# Patient Record
Sex: Female | Born: 1990 | Race: White | Hispanic: No | Marital: Married | Smoking: Never smoker
Health system: Southern US, Community
[De-identification: ages and names within clinical notes are randomized; demographics above are authoritative.]

---

## 2016-11-26 ENCOUNTER — Emergency Department (HOSPITAL_BASED_OUTPATIENT_CLINIC_OR_DEPARTMENT_OTHER): Payer: BLUE CROSS/BLUE SHIELD

## 2016-11-26 ENCOUNTER — Encounter (HOSPITAL_BASED_OUTPATIENT_CLINIC_OR_DEPARTMENT_OTHER): Payer: Self-pay | Admitting: Emergency Medicine

## 2016-11-26 ENCOUNTER — Emergency Department (HOSPITAL_BASED_OUTPATIENT_CLINIC_OR_DEPARTMENT_OTHER)
Admission: EM | Admit: 2016-11-26 | Discharge: 2016-11-26 | Disposition: A | Discharge status: Home / Self Care | Payer: BLUE CROSS/BLUE SHIELD | Attending: Emergency Medicine | Admitting: Emergency Medicine

## 2016-11-26 DIAGNOSIS — Y9389 Activity, other specified: Secondary | ICD-10-CM | POA: Diagnosis not present

## 2016-11-26 DIAGNOSIS — W500XXA Accidental hit or strike by another person, initial encounter: Secondary | ICD-10-CM | POA: Insufficient documentation

## 2016-11-26 DIAGNOSIS — S0992XA Unspecified injury of nose, initial encounter: Secondary | ICD-10-CM | POA: Insufficient documentation

## 2016-11-26 DIAGNOSIS — Y999 Unspecified external cause status: Secondary | ICD-10-CM | POA: Insufficient documentation

## 2016-11-26 DIAGNOSIS — J3489 Other specified disorders of nose and nasal sinuses: Secondary | ICD-10-CM | POA: Diagnosis not present

## 2016-11-26 DIAGNOSIS — Y929 Unspecified place or not applicable: Secondary | ICD-10-CM | POA: Diagnosis not present

## 2016-11-26 DIAGNOSIS — R0981 Nasal congestion: Secondary | ICD-10-CM | POA: Insufficient documentation
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
## 2016-11-26 NOTE — Discharge Instructions (Signed)
Use Zyrtec and/or flonase for congestion Follow up with ENT for persistent symptoms
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
## 2018-06-11 ENCOUNTER — Encounter (HOSPITAL_COMMUNITY): Payer: Self-pay

## 2018-06-11 ENCOUNTER — Emergency Department (HOSPITAL_COMMUNITY)
Admission: EM | Admit: 2018-06-11 | Discharge: 2018-06-11 | Disposition: A | Discharge status: Home / Self Care | Payer: BLUE CROSS/BLUE SHIELD | Attending: Emergency Medicine | Admitting: Emergency Medicine

## 2018-06-11 ENCOUNTER — Emergency Department (HOSPITAL_COMMUNITY): Payer: BLUE CROSS/BLUE SHIELD

## 2018-06-11 ENCOUNTER — Other Ambulatory Visit: Payer: Self-pay

## 2018-06-11 DIAGNOSIS — I10 Essential (primary) hypertension: Secondary | ICD-10-CM | POA: Insufficient documentation

## 2018-06-11 DIAGNOSIS — R0781 Pleurodynia: Secondary | ICD-10-CM

## 2018-06-11 DIAGNOSIS — R079 Chest pain, unspecified: Secondary | ICD-10-CM | POA: Diagnosis present

## 2018-06-11 LAB — I-STAT BETA HCG BLOOD, ED (MC, WL, AP ONLY): I-stat hCG, quantitative: <5 m[IU]/mL (ref <5)

## 2018-06-11 LAB — CBC WITH DIFFERENTIAL/PLATELET
Abs Immature Granulocytes: 0.06 10*3/uL (ref 0.00–0.07)
Basophils Absolute: 0.1 10*3/uL (ref 0.0–0.1)
Basophils Relative: 0 %
Eosinophils Absolute: 0.1 10*3/uL (ref 0.0–0.5)
Eosinophils Relative: 1 %
HCT: 45.7 % (ref 36.0–46.0)
Hemoglobin: 14.4 g/dL (ref 12.0–15.0)
Immature Granulocytes: 1 %
Lymphocytes Relative: 25 %
Lymphs Abs: 3.1 10*3/uL (ref 0.7–4.0)
MCH: 26.9 pg (ref 26.0–34.0)
MCHC: 31.5 g/dL (ref 30.0–36.0)
MCV: 85.4 fL (ref 80.0–100.0)
Monocytes Absolute: 0.8 10*3/uL (ref 0.1–1.0)
Monocytes Relative: 7 %
Neutro Abs: 8.2 10*3/uL — ABNORMAL HIGH (ref 1.7–7.7)
Neutrophils Relative %: 66 %
Platelets: 364 10*3/uL (ref 150–400)
RBC: 5.35 MIL/uL — ABNORMAL HIGH (ref 3.87–5.11)
RDW: 13.2 % (ref 11.5–15.5)
WBC: 12.2 10*3/uL — ABNORMAL HIGH (ref 4.0–10.5)
nRBC: 0 % (ref 0.0–0.2)

## 2018-06-11 LAB — I-STAT CHEM 8, ED
BUN: 8 mg/dL (ref 6–20)
Calcium, Ion: 0.99 mmol/L — ABNORMAL LOW (ref 1.15–1.40)
Chloride: 110 mmol/L (ref 98–111)
Creatinine, Ser: 0.8 mg/dL (ref 0.44–1.00)
Glucose, Bld: 101 mg/dL — ABNORMAL HIGH (ref 70–99)
HCT: 47 % — ABNORMAL HIGH (ref 36.0–46.0)
Hemoglobin: 16 g/dL — ABNORMAL HIGH (ref 12.0–15.0)
Potassium: 3.8 mmol/L (ref 3.5–5.1)
Sodium: 138 mmol/L (ref 135–145)
TCO2: 20 mmol/L — ABNORMAL LOW (ref 22–32)

## 2018-06-11 LAB — I-STAT TROPONIN, ED: Troponin i, poc: 0 ng/mL (ref 0.00–0.08)

## 2018-06-11 LAB — D-DIMER, QUANTITATIVE: D-Dimer, Quant: 6.69 ug/mL-FEU — ABNORMAL HIGH (ref 0.00–0.50)

## 2018-06-11 MED ORDER — IOPAMIDOL (ISOVUE-370) INJECTION 76%
INTRAVENOUS | Status: AC
  Administered 2018-06-11: 50 mL
  Filled 2018-06-11: qty 50

## 2018-06-11 NOTE — ED Notes (Signed)
Iv team unable to get iv access, EDP to attempt US IV

## 2018-06-11 NOTE — ED Notes (Signed)
Pt transported to xray 

## 2018-06-11 NOTE — ED Notes (Addendum)
IV team bedside. 

## 2018-06-11 NOTE — ED Provider Notes (Signed)
[facility] EMERGENCY DEPARTMENT Provider Note   CSN: [id number] Arrival date & time: 06/11/18  1233     History   Chief Complaint Chief Complaint  Patient presents with  . Chest Pain    HPI Ruth Strong is a 27 y.o. female who is previously healthy who presents with a one-week history of shortness of breath and 1 day history of pleuritic chest pain.  She describes the pain as sharp.  It is only present when she takes a deep breath.  She reports she has had a little bit of worsening dyspnea on exertion for the past week.  She reports she has never experienced anything like this before.  She reports she took a trip to [place] the end of October and feels like she has been short of breath around since then, but worse in the past week.  Patient reports that she has taken her blood pressure in the past couple days and has been elevated significantly every time.  Her heart rate has also been up to 107.  She has no history of hypertension.  She reports trying half of Ativan last night, which is her mother's, to see if that was anxiety related, however this did not resolve her symptoms.  She denies any recent surgeries, new leg pain or swelling, personal history of blood clots.  She is on oral contraceptive since age 21.  She has family history of PE.  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  Patient denies any drug or alcohol use.  HPI  History reviewed. No pertinent past medical history.  There are no active problems to display for this patient.   History reviewed. No pertinent surgical history.   OB History   None      Home Medications    Prior to Admission medications   Not on File    Family History History reviewed. No pertinent family history.  Social History Social History   Tobacco Use  . Smoking status: Never Smoker  . Smokeless tobacco: Never Used  Substance Use Topics  . Alcohol use: Never    Frequency: Never  . Drug use:  Never     Allergies   Patient has no known allergies.   Review of Systems Review of Systems  Constitutional: Negative for chills and fever.  HENT: Negative for facial swelling and sore throat.   Respiratory: Positive for shortness of breath.   Cardiovascular: Positive for chest pain.  Gastrointestinal: Negative for abdominal pain, nausea and vomiting.  Genitourinary: Negative for dysuria.  Musculoskeletal: Negative for back pain.  Skin: Negative for rash and wound.  Neurological: Negative for headaches.  Psychiatric/Behavioral: The patient is not nervous/anxious.      Physical Exam Updated Vital Signs BP (!) 155/105 (BP Location: Right Arm)   Pulse 91   Temp 97.6 F (36.4 C) (Oral)   Resp 17   LMP 05/28/2018 (Approximate)   SpO2 100%   Physical Exam  Constitutional: She appears well-developed and well-nourished. No distress.  HENT:  Head: Normocephalic and atraumatic.  Mouth/Throat: Oropharynx is clear and moist. No oropharyngeal exudate.  Eyes: Pupils are equal, round, and reactive to light. Conjunctivae are normal. Right eye exhibits no discharge. Left eye exhibits no discharge. No scleral icterus.  Neck: Normal range of motion. Neck supple. No thyromegaly present.  Cardiovascular: Normal rate, regular rhythm, normal heart sounds and intact distal pulses. Exam reveals no gallop and no friction rub.  No murmur heard.  Pulmonary/Chest: Effort normal and breath sounds normal. No stridor. No respiratory distress. She has no wheezes. She has no rales. She exhibits no tenderness.  Abdominal: Soft. Bowel sounds are normal. She exhibits no distension. There is no tenderness. There is no rebound and no guarding.  Musculoskeletal: She exhibits no edema.  Lymphadenopathy:    She has no cervical adenopathy.  Neurological: She is alert. Coordination normal.  Skin: Skin is warm and dry. No rash noted. She is not diaphoretic. No pallor.  Psychiatric: She has a normal mood and  affect.  Nursing note and vitals reviewed.    ED Treatments / Results  Labs (all labs ordered are listed, but only abnormal results are displayed) Labs Reviewed  CBC WITH DIFFERENTIAL/PLATELET - Abnormal; Notable for the following components:      Result Value   WBC 12.2 (*)    RBC 5.35 (*)    Neutro Abs 8.2 (*)    All other components within normal limits  D-DIMER, QUANTITATIVE (NOT AT [facility]) - Abnormal; Notable for the following components:   D-Dimer, Quant 6.69 (*)    All other components within normal limits  I-STAT CHEM 8, ED - Abnormal; Notable for the following components:   Glucose, Bld 101 (*)    Calcium, Ion 0.99 (*)    TCO2 20 (*)    Hemoglobin 16.0 (*)    HCT 47.0 (*)    All other components within normal limits  BASIC METABOLIC PANEL  I-STAT TROPONIN, ED  I-STAT BETA HCG BLOOD, ED ([facility], [facility], [facility] ONLY)    EKG None  Radiology Dg Chest 2 View  Result Date: 06/11/2018 CLINICAL DATA:  Intermittent left chest pain with shortness of breath. EXAM: CHEST - 2 VIEW COMPARISON:  None. FINDINGS: The heart size and mediastinal contours are within normal limits. Both lungs are clear. The visualized skeletal structures are unremarkable. IMPRESSION: No active cardiopulmonary disease. Electronically Signed   By: Kennith CenterEric  Mansell M.D.   On: 06/11/2018 14:17    Procedures Procedures (including critical care time)  Medications Ordered in ED Medications - No data to display   Initial Impression / Assessment and Plan / ED Course  I have reviewed the triage vital signs and the nursing notes.  Pertinent labs & imaging results that were available during my care of the patient were reviewed by me and considered in my medical decision making (see chart for details).     Patient with presentation concerning for pulmonary embolism.  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  She is on OCP.  Her d-dimer is 6.69.  Troponin  negative.  CBC WBC 12.2.  Chest x-ray is clear.  EKG shows NSR.  CT angiogram pending.  IV team consulted for IV placement. At shift change, patient care transferred to Army MeliaLaura Murphy, PA-C for continued evaluation, follow up of CTA and determination of disposition.  If CTA negative, treat for chest wall pain versus pleurisy with NSAIDs, PCP follow-up.  Final Clinical Impressions(s) / ED Diagnoses   Final diagnoses:  Pleuritic chest pain  Hypertension, unspecified type    ED Discharge Orders    None       Emi HolesLaw, Torrez Renfroe M, PA-C 06/11/18 1521    Tilden Fossaees, Elizabeth, MD 06/11/18 [phone]

## 2018-06-11 NOTE — ED Provider Notes (Signed)
Assumed care of patient at shift change from Trinna PostAlex, PA-C, briefly- SHOB x 1 week, CP yesterday, on OCP with elevated dimer, pending CTA r/o PE. Physical Exam  BP 118/76 (BP Location: Left Arm)   Pulse 81   Temp 97.6 F (36.4 C) (Oral)   Resp 16   Ht 5\' 6"  (1.676 m)   Wt 47.6 kg   LMP 05/28/2018 (Approximate)   SpO2 99%   BMI 16.94 kg/m   Physical Exam  ED Course/Procedures     Procedures  MDM  CTA chest returns and is negative for PE, no other acute abnormality identified.  Patient's blood pressure has improved and is currently 118/76.  Discussed results and plan of care with patient and family at bedside.  Patient has a local GYN who she sees however is currently living in [place] where she attends school.  Patient plans to follow-up with local family doctor or [place].  Patient advised to return to ER for any further syncopal episodes, any new or worsening symptoms.       Jeannie FendMurphy, Sanad Fearnow A, PA-C 06/11/18 1752    Tilden Fossaees, Elizabeth, MD 06/11/18 [phone]

## 2018-06-11 NOTE — ED Notes (Signed)
Attempted IV 2X right AC, unable to obtain or get blood work. Another RN attempting at this time.

## 2018-06-11 NOTE — Discharge Instructions (Addendum)
Follow-up with your local provider or divider in [place]. Return to the ER for any new or worsening symptoms.

## 2018-06-11 NOTE — ED Notes (Signed)
Patient transported to CT 

## 2018-06-11 NOTE — ED Notes (Signed)
ED Provider at bedside. 

## 2018-06-11 NOTE — ED Notes (Signed)
Pt returned to room  

## 2018-06-11 NOTE — ED Triage Notes (Signed)
Onset 1 week chest pain and shortness of breath on exertion and hard to take deep breaths.  Talking in complete sentences.  NAD.

## 2018-06-11 NOTE — ED Notes (Signed)
Patient transported to X-ray 

## 2018-06-11 NOTE — ED Notes (Signed)
Pt returned from CT °

## 2020-08-04 IMAGING — CT CT ANGIO CHEST
2 of 6 series · 19 of 46 positions shown · IV contrast (iopamidol)
Comparison: Chest radiographs obtained earlier today.

CLINICAL DATA: Chest pain and exertional shortness of breath for
the past week. Positive D-dimer.

EXAM:
CT ANGIOGRAPHY CHEST WITH CONTRAST
TECHNIQUE: Multidetector CT imaging of the chest was performed using the
standard protocol during bolus administration of intravenous
contrast. Multiplanar CT image reconstructions and MIPs were
obtained to evaluate the vascular anatomy.
CONTRAST:  50mL 0PABAN-3MF IOPAMIDOL (0PABAN-3MF) INJECTION 76%

[Series 8: thins · axial · 0.49mm/px · z∈[+868,+1117]mm · 16 of 389 slices shown]
[im 17/389  lung]
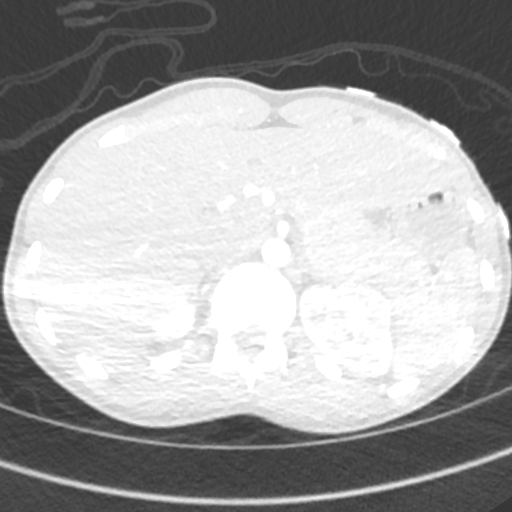
[im 51/389  soft-tissue]
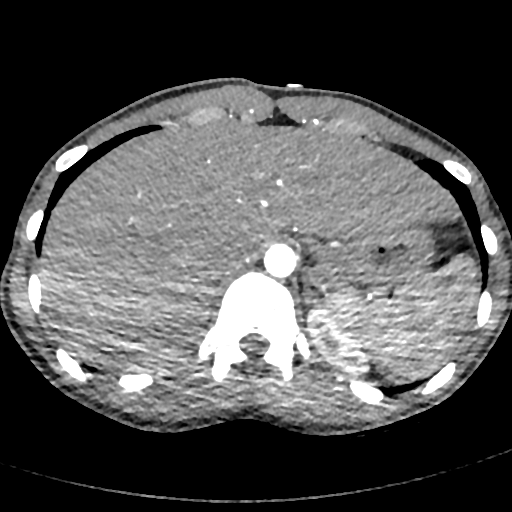
[im 68/389  lung]
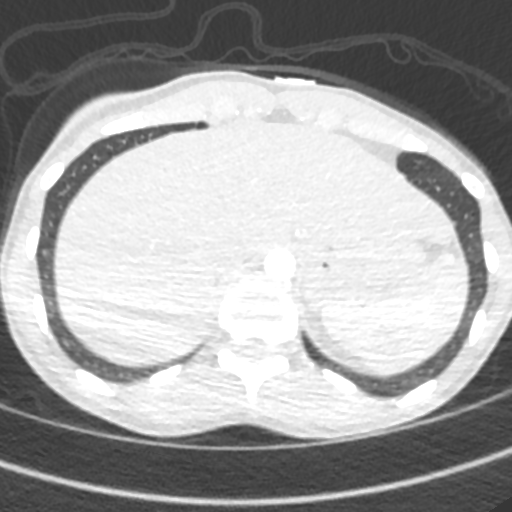
[im 85/389  soft-tissue]
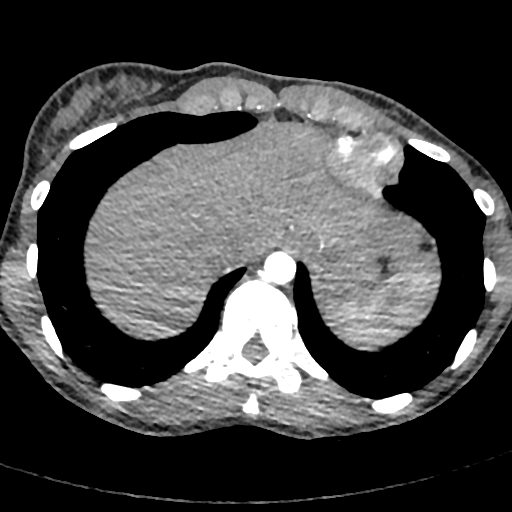
[im 119/389  lung]
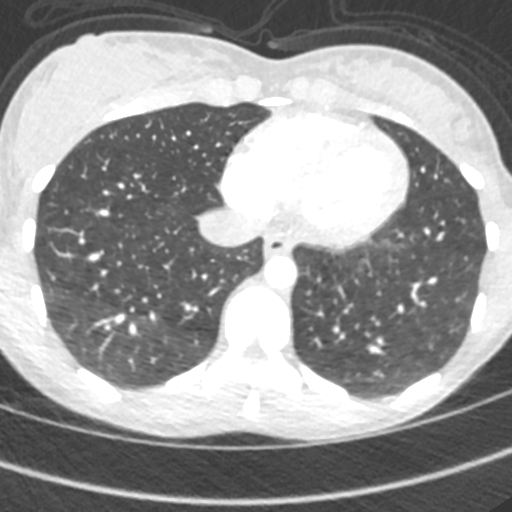
[im 135/389  soft-tissue]
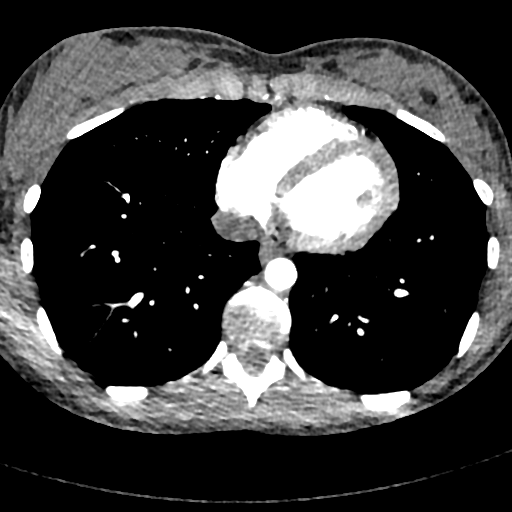
[im 152/389  lung]
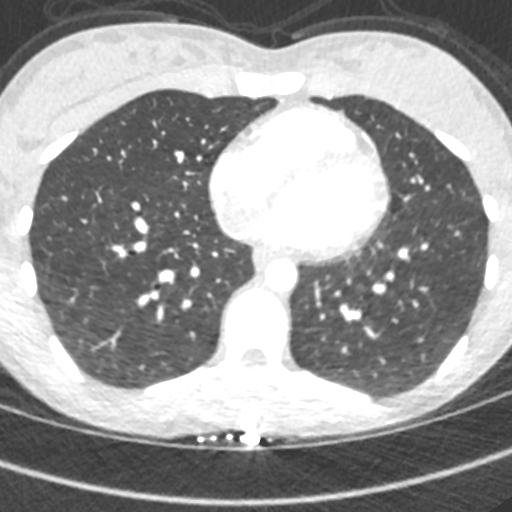
[im 186/389  soft-tissue]
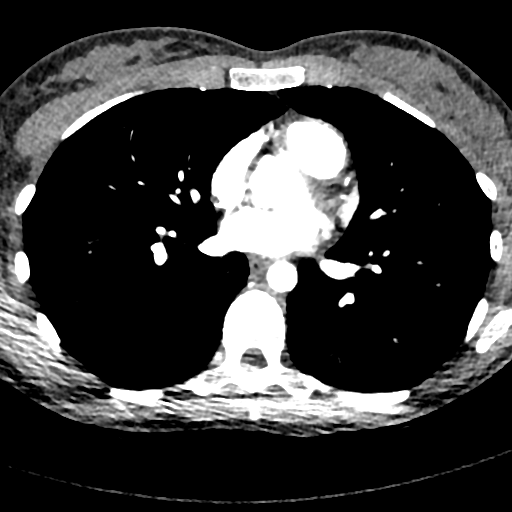
[im 203/389  lung]
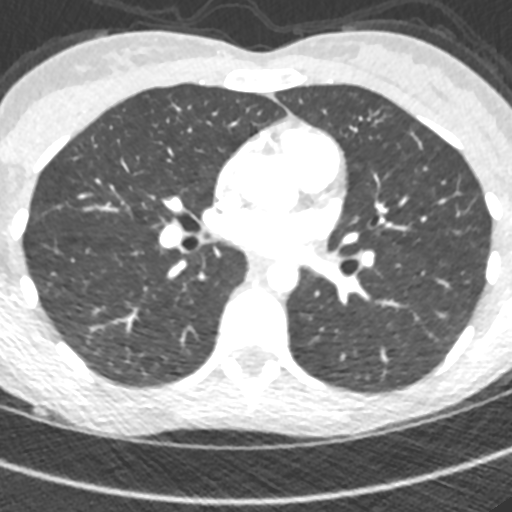
[im 237/389  soft-tissue]
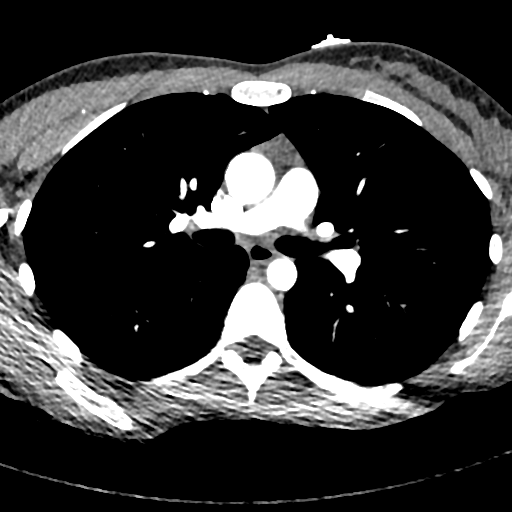
[im 254/389  lung]
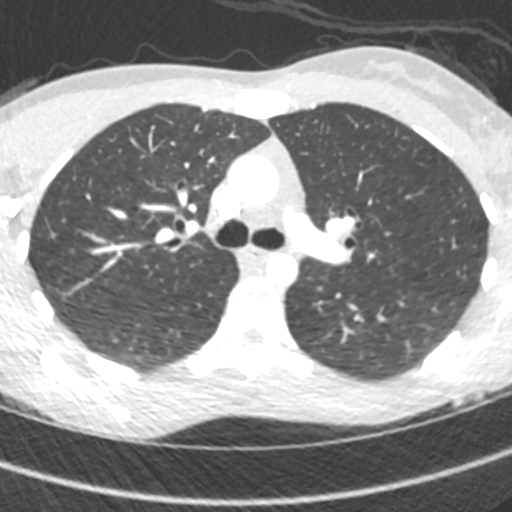
[im 270/389  soft-tissue]
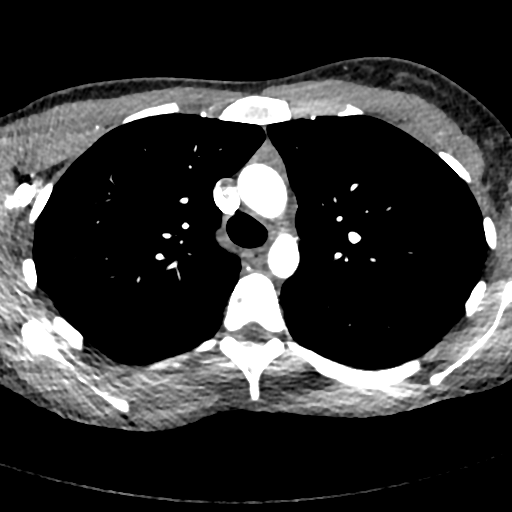
[im 304/389  lung]
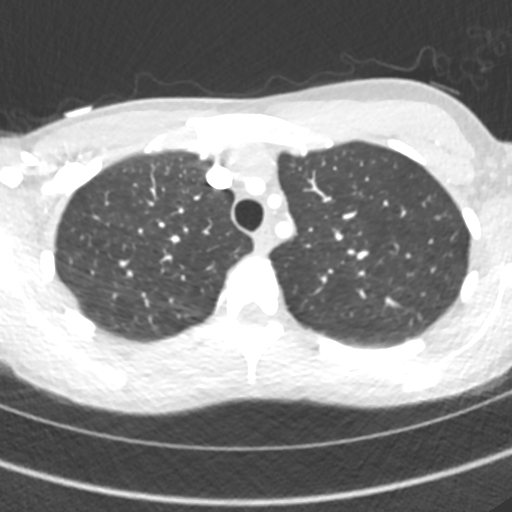
[im 321/389  soft-tissue]
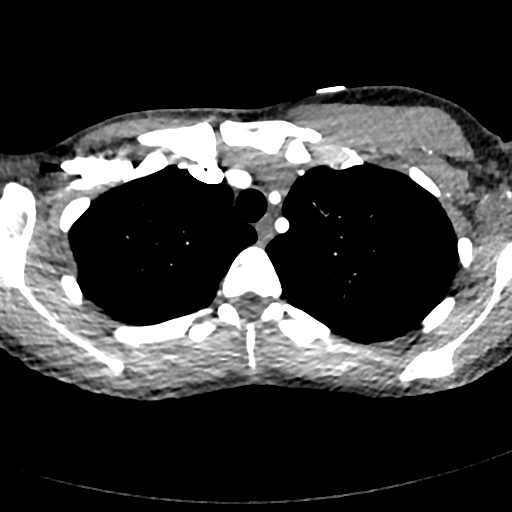
[im 338/389  lung]
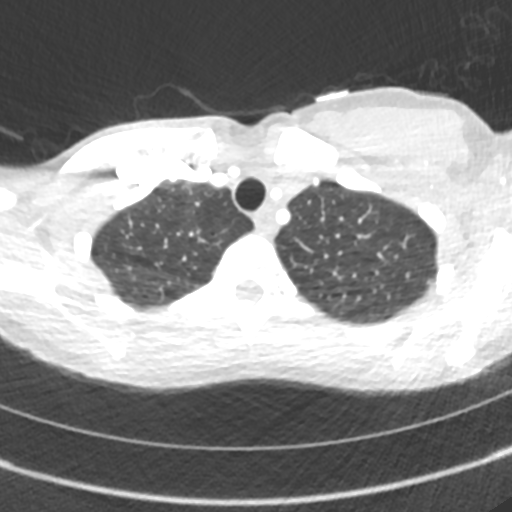
[im 372/389  soft-tissue]
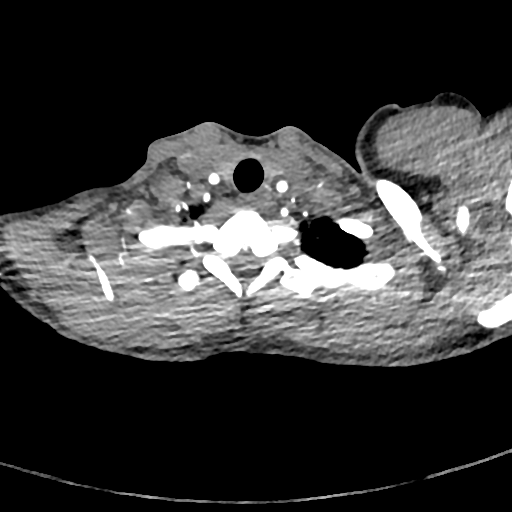

[Series 9: cor · coronal · 0.53mm/px · 3 of 88 slices shown]
[im 22/88  soft-tissue]
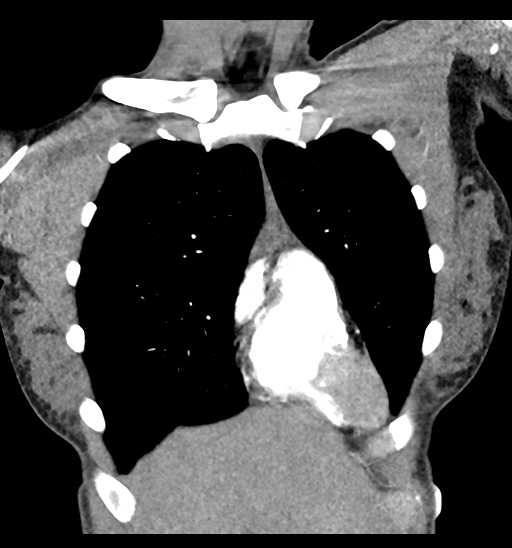
[im 44/88  soft-tissue]
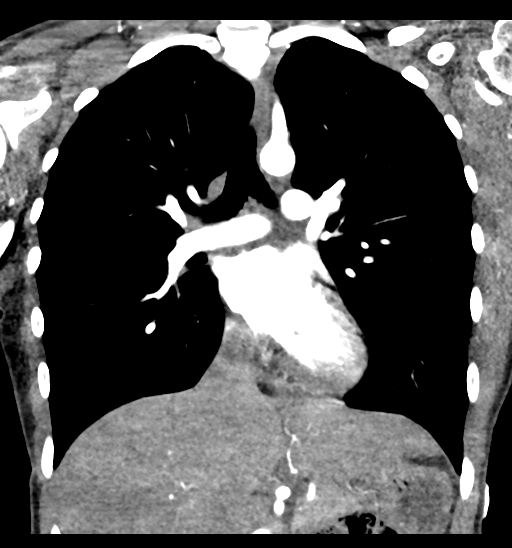
[im 66/88  soft-tissue]
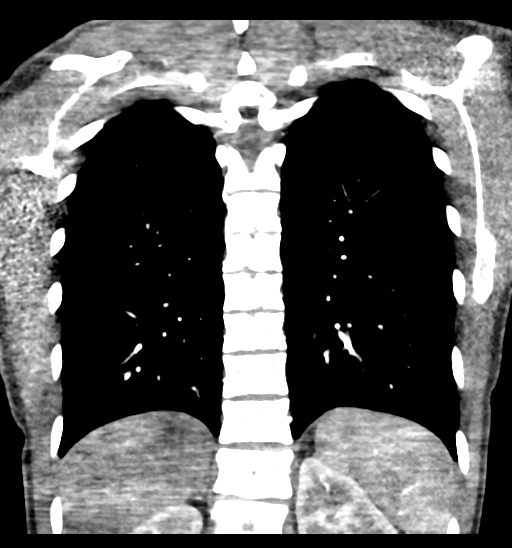

[19 of 46 positions shown; findings below may reference images not displayed]

FINDINGS: Cardiovascular: Satisfactory opacification of the pulmonary arteries
to the segmental level. No evidence of pulmonary embolism. Normal
heart size. No pericardial effusion.

Mediastinum/Nodes: No enlarged mediastinal, hilar, or axillary lymph
nodes. Thyroid gland, trachea, and esophagus demonstrate no
significant findings. Normal appearing thymus gland.

Lungs/Pleura: Lungs are clear. No pleural effusion or pneumothorax.

Upper Abdomen: Unremarkable.

Musculoskeletal: Unremarkable bones.

Review of the MIP images confirms the above findings.
IMPRESSION: Normal examination.
